# Patient Record
Sex: Male | Born: 2000 | Race: Black or African American | Hispanic: No | Marital: Single | Smoking: Never smoker
Health system: Southern US, Community
[De-identification: ages and names within clinical notes are randomized; demographics above are authoritative.]

## PROBLEM LIST (undated history)

## (undated) DIAGNOSIS — J45909 Unspecified asthma, uncomplicated: Secondary | ICD-10-CM

## (undated) HISTORY — PX: OTHER SURGICAL HISTORY: SHX169

---
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
## 2018-06-23 ENCOUNTER — Emergency Department
Admission: EM | Admit: 2018-06-23 | Discharge: 2018-06-23 | Disposition: A | Discharge status: Home / Self Care | Payer: Medicaid Other | Attending: Student in an Organized Health Care Education/Training Program | Admitting: Student in an Organized Health Care Education/Training Program

## 2018-06-23 ENCOUNTER — Encounter: Payer: Self-pay | Admitting: Emergency Medicine

## 2018-06-23 ENCOUNTER — Emergency Department: Payer: Medicaid Other

## 2018-06-23 ENCOUNTER — Other Ambulatory Visit: Payer: Self-pay

## 2018-06-23 DIAGNOSIS — Y92310 Basketball court as the place of occurrence of the external cause: Secondary | ICD-10-CM | POA: Insufficient documentation

## 2018-06-23 DIAGNOSIS — X509XXA Other and unspecified overexertion or strenuous movements or postures, initial encounter: Secondary | ICD-10-CM | POA: Insufficient documentation

## 2018-06-23 DIAGNOSIS — Y999 Unspecified external cause status: Secondary | ICD-10-CM | POA: Insufficient documentation

## 2018-06-23 DIAGNOSIS — S63502A Unspecified sprain of left wrist, initial encounter: Secondary | ICD-10-CM | POA: Insufficient documentation

## 2018-06-23 DIAGNOSIS — Y9367 Activity, basketball: Secondary | ICD-10-CM | POA: Insufficient documentation

## 2018-06-23 NOTE — ED Notes (Addendum)
See triage note  Developed pain to left wrist for about 3 days  States pain started while playing b/b  Good pulses   No deformity

## 2018-06-23 NOTE — ED Triage Notes (Signed)
C/O right wrist pain x 1 day.  Unsure if injured wrist.  Pain with movement.  Full ROM demonstrated.  No swelling seen.

## 2018-06-23 NOTE — Discharge Instructions (Addendum)
Follow-up with your regular doctor or Dr. Allena KatzPatel if not better in 5 7 days.  Wear the wrist splint during the day for the next 3 to 4 days.  Wear it at night for 1 week.  If it is not improved at that time please call Dr. Eliane DecreePatel's office.  Take Tylenol or ibuprofen.  Apply ice to the left wrist.  Return as needed.

## 2018-06-23 NOTE — ED Provider Notes (Signed)
[facility] Emergency Department Provider Note  ____________________________________________   First MD Initiated Contact with Patient 06/23/18 1154     (approximate)  I have reviewed the triage vital signs and the nursing notes.   HISTORY  Chief Complaint Wrist Pain    HPI Coralee NorthGregg Coad is a 17 y.o. male presents emergency department complaining of left wrist pain for about 3 days.  He states he played basketball last week and thinks he injured it while playing.  He denies any numbness or tingling.  Denies any other injuries.    History reviewed. No pertinent past medical history.  There are no active problems to display for this patient.   Past Surgical History:  Procedure Laterality Date  . thumb surgery Right     Prior to Admission medications   Not on File    Allergies Patient has no known allergies.  No family history on file.  Social History Social History   Tobacco Use  . Smoking status: Never Smoker  . Smokeless tobacco: Never Used  Substance Use Topics  . Alcohol use: No  . Drug use: No    Review of Systems  Constitutional: No fever/chills Eyes: No visual changes. ENT: No sore throat. Respiratory: Denies cough Genitourinary: Negative for dysuria. Musculoskeletal: Negative for back pain.  Positive left wrist pain Skin: Negative for rash.    ____________________________________________   PHYSICAL EXAM:  VITAL SIGNS: ED Triage Vitals  Enc Vitals Group     BP 06/23/18 1127 132/66     Pulse Rate 06/23/18 1127 64     Resp --      Temp 06/23/18 1127 98 F (36.7 C)     Temp Source 06/23/18 1127 Oral     SpO2 06/23/18 1127 99 %     Weight 06/23/18 1127 143 lb (64.9 kg)     Height 06/23/18 1127 5\' 8"  (1.727 m)     Head Circumference --      Peak Flow --      Pain Score 06/23/18 1129 7     Pain Loc --      Pain Edu? --      Excl. in GC? --     Constitutional: Alert and oriented. Well appearing and in no  acute distress. Eyes: Conjunctivae are normal.  Head: Atraumatic. Nose: No congestion/rhinnorhea. Mouth/Throat: Mucous membranes are moist.   Neck:  supple no lymphadenopathy noted Cardiovascular: Normal rate, regular rhythm.  Respiratory: Normal respiratory effort.  GU: deferred Musculoskeletal: FROM all extremities, warm and well perfused.  Left wrist is minimally tender along the carpal bones.  Neurovascular is intact.  He has full range of motion without any difficulty. Neurologic:  Normal speech and language.  Skin:  Skin is warm, dry and intact. No rash noted. Psychiatric: Mood and affect are normal. Speech and behavior are normal.  ____________________________________________   LABS (all labs ordered are listed, but only abnormal results are displayed)  Labs Reviewed - No data to display ____________________________________________   ____________________________________________  RADIOLOGY  X-ray left wrist is negative  ____________________________________________   PROCEDURES  Procedure(s) performed: Velcro cock-up splint was applied by nursing staff  Procedures    ____________________________________________   INITIAL IMPRESSION / ASSESSMENT AND PLAN / ED COURSE  Pertinent labs & imaging results that were available during my care of the patient were reviewed by me and considered in my medical decision making (see chart for details).   Patient is a 17 year old male presents emergency department complaining of left  wrist pain for about 3 days.  Thinks he injured it while playing basketball.  Physical exam patient appears well.  The left wrist is minimally tender along the carpal bones.  Neurovascular is intact.  And he has full range of motion.  X-ray of the left wrist is negative for any acute abnormalities  Explained the x-ray results to the patient.  A cock-up splint was applied by the nursing staff.  He is to take over-the-counter Tylenol or ibuprofen  for pain as needed.  Apply ice.  Follow-up with orthopedics if not better in 7 to 10 days.  He states he understands will comply with instructions.  He was discharged in stable condition     As part of my medical decision making, I reviewed the following data within the electronic MEDICAL RECORD NUMBER Nursing notes reviewed and incorporated, Radiograph reviewed x-ray left wrist is negative, Notes from prior ED visits and [place] Controlled Substance Database  ____________________________________________   FINAL CLINICAL IMPRESSION(S) / ED DIAGNOSES  Final diagnoses:  Sprain of left wrist, initial encounter      NEW MEDICATIONS STARTED DURING THIS VISIT:  Current Discharge Medication List       Note:  This document was prepared using Dragon voice recognition software and may include unintentional dictation errors.    Faythe Ghee, PA-C 06/23/18 1254    Willy Eddy, MD 06/23/18 1329
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
## 2019-10-07 ENCOUNTER — Ambulatory Visit: Payer: Medicaid Other | Attending: Internal Medicine

## 2019-10-08 ENCOUNTER — Ambulatory Visit: Payer: Medicaid Other | Attending: Internal Medicine

## 2019-10-08 DIAGNOSIS — Z20822 Contact with and (suspected) exposure to covid-19: Secondary | ICD-10-CM

## 2019-10-10 LAB — NOVEL CORONAVIRUS, NAA: SARS-CoV-2, NAA: NOT DETECTED

## 2019-10-24 IMAGING — DX DG WRIST COMPLETE 3+V*L*
4 series · 4 of 4 positions shown · non-contrast
Comparison: None.

CLINICAL DATA: Left wrist pain x 2 days. Pt states pain is "inside
the joint not on the outside." Pt also pointed to posterior aspect
of wrist w/ flexion small marble sized lump noted mid posterior
wrist. Pt unsure of injury states he plays basketball so it could've
happened then but doesn't recall anything. No prior injury or
surgery.

EXAM:
LEFT WRIST - COMPLETE 3+ VIEW

[wrist ap (1 of 2)]
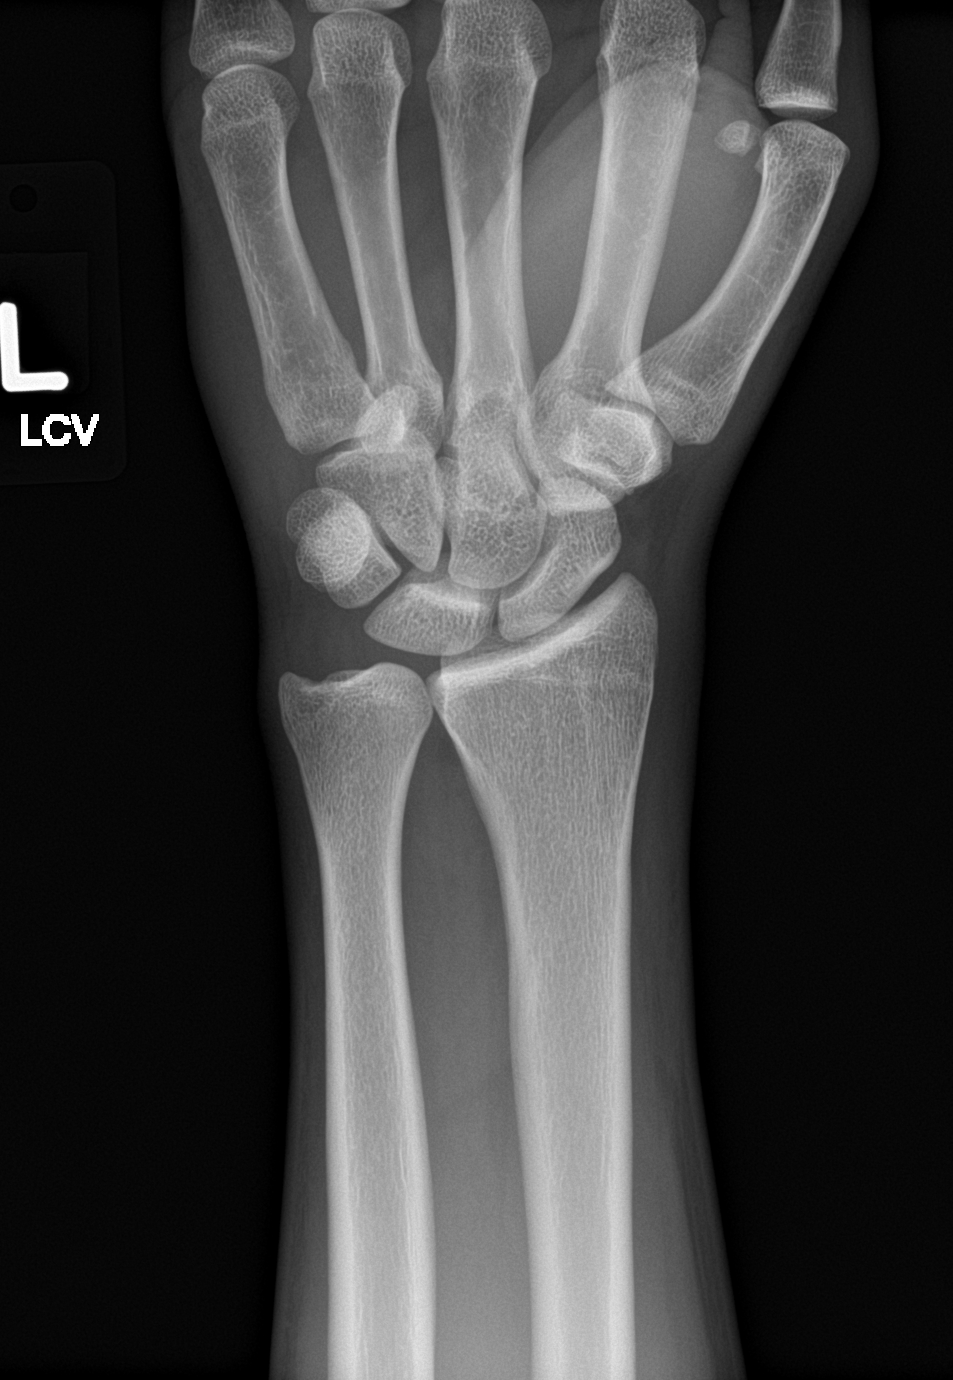

[wrist obl]
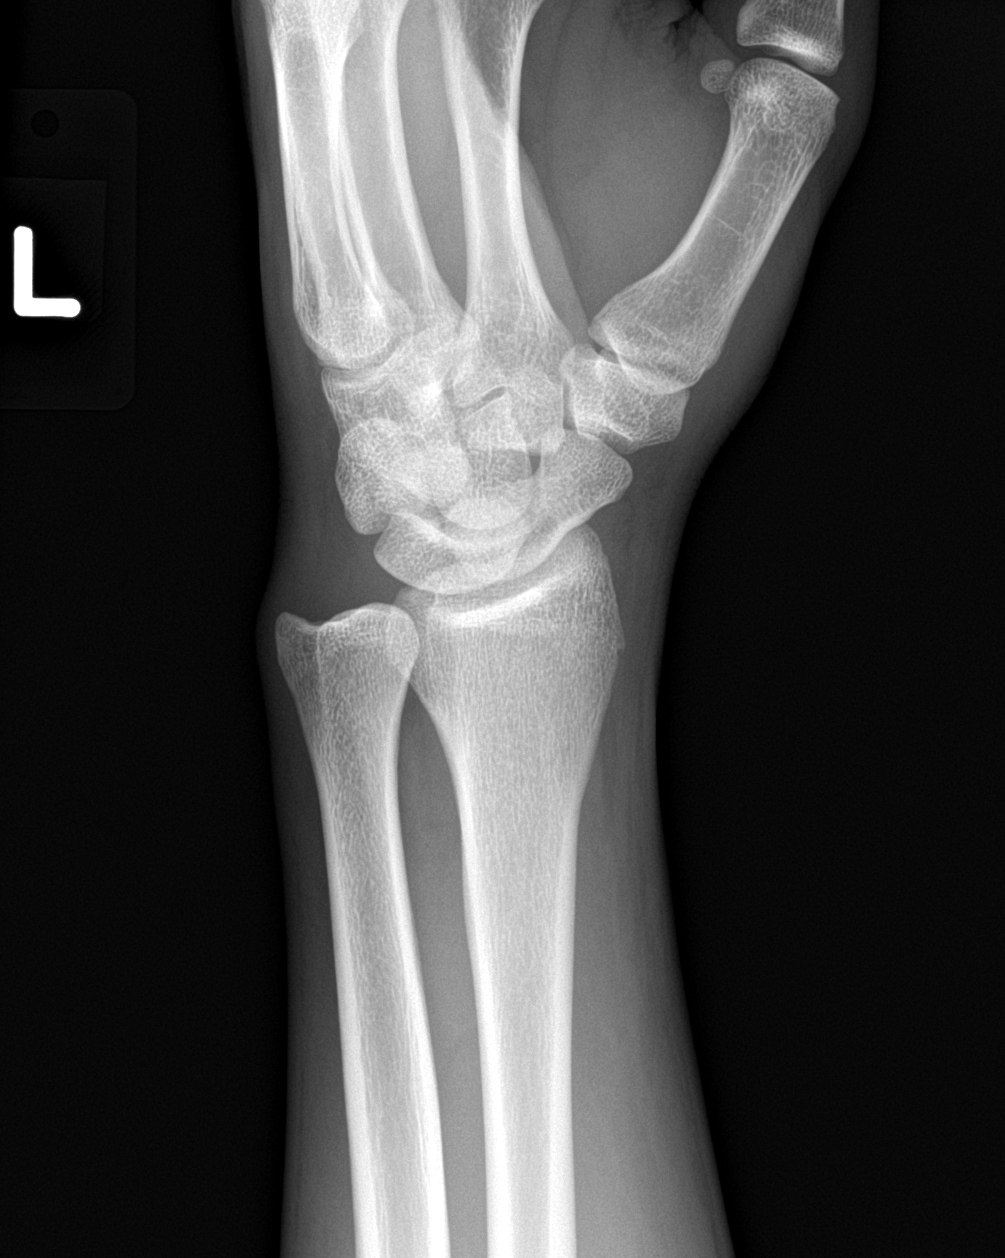

[wrist lat]
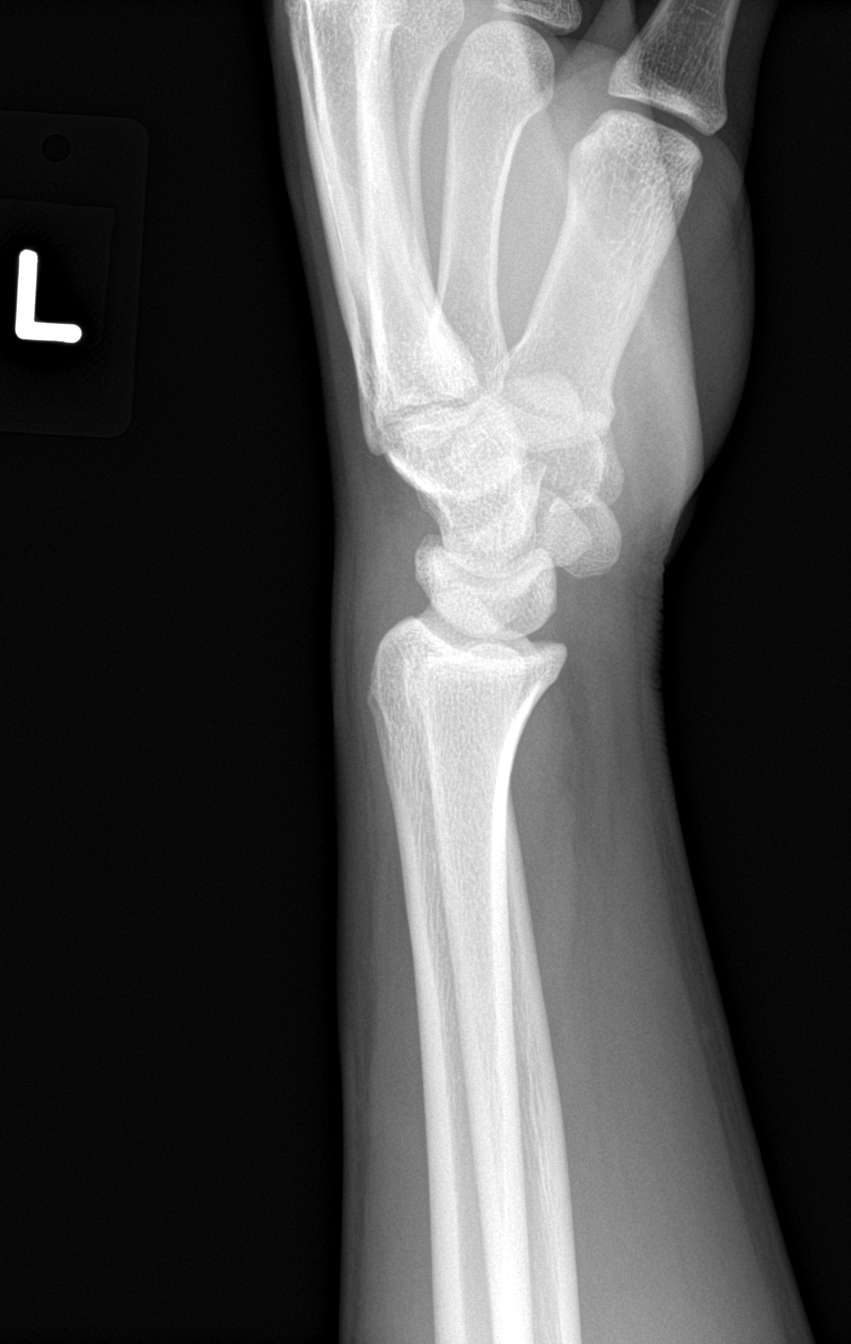

[wrist ap (2 of 2)]
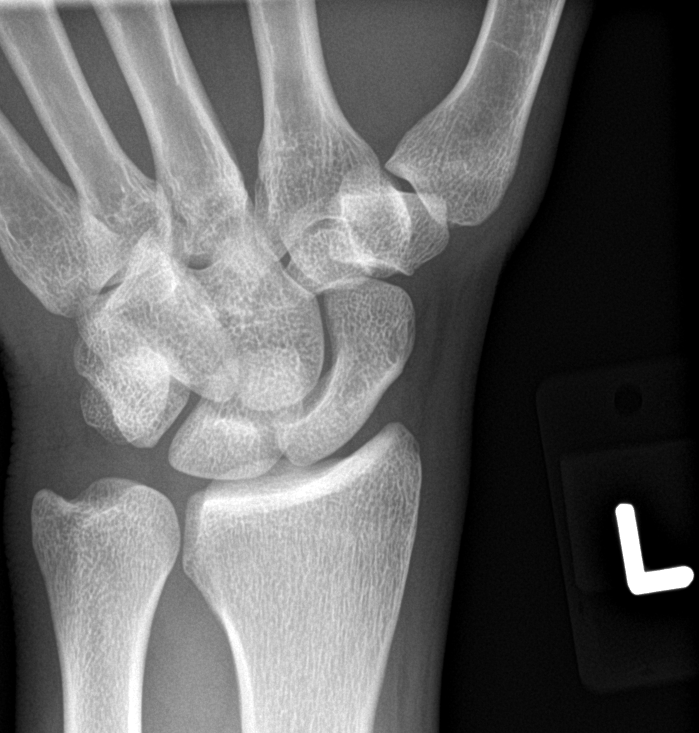

[4 of 4 positions shown; findings below may reference images not displayed]

FINDINGS: There is no evidence of fracture or dislocation. There is no
evidence of arthropathy or other focal bone abnormality. Soft
tissues are unremarkable.
IMPRESSION: Negative.

## 2019-12-02 ENCOUNTER — Other Ambulatory Visit: Payer: Self-pay

## 2019-12-02 ENCOUNTER — Emergency Department: Payer: Medicaid Other

## 2019-12-02 ENCOUNTER — Emergency Department
Admission: EM | Admit: 2019-12-02 | Discharge: 2019-12-02 | Disposition: A | Discharge status: Home / Self Care | Payer: Medicaid Other | Attending: Emergency Medicine | Admitting: Emergency Medicine

## 2019-12-02 ENCOUNTER — Encounter: Payer: Self-pay | Admitting: Emergency Medicine

## 2019-12-02 DIAGNOSIS — Y929 Unspecified place or not applicable: Secondary | ICD-10-CM | POA: Insufficient documentation

## 2019-12-02 DIAGNOSIS — Y999 Unspecified external cause status: Secondary | ICD-10-CM | POA: Insufficient documentation

## 2019-12-02 DIAGNOSIS — S61011A Laceration without foreign body of right thumb without damage to nail, initial encounter: Secondary | ICD-10-CM | POA: Insufficient documentation

## 2019-12-02 DIAGNOSIS — F17200 Nicotine dependence, unspecified, uncomplicated: Secondary | ICD-10-CM | POA: Insufficient documentation

## 2019-12-02 DIAGNOSIS — S61411A Laceration without foreign body of right hand, initial encounter: Secondary | ICD-10-CM | POA: Insufficient documentation

## 2019-12-02 DIAGNOSIS — Y939 Activity, unspecified: Secondary | ICD-10-CM | POA: Insufficient documentation

## 2019-12-02 DIAGNOSIS — J45909 Unspecified asthma, uncomplicated: Secondary | ICD-10-CM | POA: Insufficient documentation

## 2019-12-02 DIAGNOSIS — W25XXXA Contact with sharp glass, initial encounter: Secondary | ICD-10-CM | POA: Insufficient documentation

## 2019-12-02 DIAGNOSIS — Z23 Encounter for immunization: Secondary | ICD-10-CM | POA: Insufficient documentation

## 2019-12-02 DIAGNOSIS — S61216A Laceration without foreign body of right little finger without damage to nail, initial encounter: Secondary | ICD-10-CM | POA: Insufficient documentation

## 2019-12-02 HISTORY — DX: Unspecified asthma, uncomplicated: J45.909

## 2019-12-02 MED ORDER — LIDOCAINE HCL (PF) 1 % IJ SOLN
5.0000 mL | Freq: Once | INTRAMUSCULAR | Status: AC
  Administered 2019-12-02: 22:00:00 5 mL via INTRADERMAL
  Filled 2019-12-02: qty 5

## 2019-12-02 MED ORDER — CEPHALEXIN 500 MG PO CAPS: 500.0000 mg | ORAL_CAPSULE | Freq: Four times a day (QID) | ORAL | 0 refills | Status: AC

## 2019-12-02 MED ORDER — CEFTRIAXONE SODIUM 1 G IJ SOLR
1.0000 g | Freq: Once | INTRAMUSCULAR | Status: AC
  Administered 2019-12-02: 22:00:00 1 g via INTRAMUSCULAR
  Filled 2019-12-02: qty 10

## 2019-12-02 MED ORDER — BACITRACIN ZINC 500 UNIT/GM EX OINT: TOPICAL_OINTMENT | Freq: Once | CUTANEOUS | Status: AC

## 2019-12-02 MED ORDER — LIDOCAINE-EPINEPHRINE (PF) 1 %-1:200000 IJ SOLN
10.0000 mL | Freq: Once | INTRAMUSCULAR | Status: DC
  Filled 2019-12-02: qty 10

## 2019-12-02 MED ORDER — TETANUS-DIPHTH-ACELL PERTUSSIS 5-2.5-18.5 LF-MCG/0.5 IM SUSP
0.5000 mL | Freq: Once | INTRAMUSCULAR | Status: AC
  Administered 2019-12-02: 22:00:00 0.5 mL via INTRAMUSCULAR
  Filled 2019-12-02: qty 0.5

## 2019-12-02 MED ORDER — TRAMADOL HCL 50 MG PO TABS: 50.0000 mg | ORAL_TABLET | Freq: Four times a day (QID) | ORAL | 0 refills | Status: AC | PRN
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
## 2019-12-02 NOTE — ED Notes (Signed)
Provider at bedside

## 2019-12-02 NOTE — ED Provider Notes (Signed)
[facility] Emergency Department Provider Note  ____________________________________________  Time seen: Approximately 8:38 PM  I have reviewed the triage vital signs and the nursing notes.   HISTORY  Chief Complaint Laceration    HPI Seth Harvey. is a 19 y.o. male that presents to the emergency department for evaluation of left hand laceration.  Patient states that he was leaning on a glass back door when the glass gave way and he went through the glass.  He has several lacerations to his hand.  He is having numbness to the side of his hand.  Past Medical History:  Diagnosis Date  . Asthma     There are no problems to display for this patient.   Past Surgical History:  Procedure Laterality Date  . thumb surgery Right     Prior to Admission medications   Medication Sig Start Date End Date Taking? Authorizing Provider  cephALEXin (KEFLEX) 500 MG capsule Take 1 capsule (500 mg total) by mouth 4 (four) times daily for 10 days. 12/02/19 12/12/19  Laban Emperor, PA-C  traMADol (ULTRAM) 50 MG tablet Take 1 tablet (50 mg total) by mouth every 6 (six) hours as needed. 12/02/19 12/01/20  Laban Emperor, PA-C    Allergies Patient has no known allergies.  No family history on file.  Social History Social History   Tobacco Use  . Smoking status: Current Every Day Smoker  . Smokeless tobacco: Never Used  Substance Use Topics  . Alcohol use: No  . Drug use: No     Review of Systems  Respiratory: No SOB. Gastrointestinal: No abdominal pain.  No nausea, no vomiting.  Musculoskeletal: Positive for hand pain. Skin: Negative for rash, abrasions, ecchymosis.  Positive for lacerations. Neurological: Negative for headache.  Positive for numbness and tingling.   ____________________________________________   PHYSICAL EXAM:  VITAL SIGNS: ED Triage Vitals  Enc Vitals Group     BP 12/02/19 1943 118/83     Pulse Rate 12/02/19 1943 65     Resp --       Temp 12/02/19 1943 98 F (36.7 C)     Temp Source 12/02/19 1943 Oral     SpO2 12/02/19 1943 98 %     Weight 12/02/19 1939 160 lb (72.6 kg)     Height 12/02/19 1939 5\' 8"  (1.727 m)     Head Circumference --      Peak Flow --      Pain Score 12/02/19 1941 10     Pain Loc --      Pain Edu? --      Excl. in [place]? --      Constitutional: Alert and oriented. Well appearing and in no acute distress. Eyes: Conjunctivae are normal. PERRL. EOMI. Head: Atraumatic. ENT:      Ears:      Nose: No congestion/rhinnorhea.      Mouth/Throat: Mucous membranes are moist.  Neck: No stridor.  Cardiovascular: Normal rate, regular rhythm.  Good peripheral circulation. Respiratory: Normal respiratory effort without tachypnea or retractions. Lungs CTAB. Good air entry to the bases with no decreased or absent breath sounds. Musculoskeletal: Full range of motion to all extremities. No gross deformities appreciated.  Full range of motion of all 5 digits.  Able to perform resisted flexion of each digit.  2 point sensation intact.  Neurologic:  Normal speech and language. No gross focal neurologic deficits are appreciated.  Skin:  Skin is warm, dry. Multiple lacerations to right hand.  No  visible retained foreign bodies. Psychiatric: Mood and affect are normal. Speech and behavior are normal. Patient exhibits appropriate insight and judgement.   ____________________________________________   LABS (all labs ordered are listed, but only abnormal results are displayed)  Labs Reviewed - No data to display ____________________________________________  EKG   ____________________________________________  RADIOLOGY Lexine Baton, personally viewed and evaluated these images (plain radiographs) as part of my medical decision making, as well as reviewing the written report by the radiologist.  DG Hand Complete Right  Result Date: 12/02/2019 CLINICAL DATA:  Fall with laceration EXAM: RIGHT HAND -  COMPLETE 3+ VIEW COMPARISON:  None. FINDINGS: Limited by positioning. No displaced fracture or malalignment. No radiopaque foreign body in the soft tissues. IMPRESSION: No acute osseous abnormality. No radiopaque foreign body is identified Electronically Signed   By: Jasmine Pang M.D.   On: 12/02/2019 19:55    ____________________________________________    PROCEDURES  Procedure(s) performed:    Procedures  LACERATION REPAIR Performed by: Enid Derry  Consent: Verbal consent obtained.  Consent given by: patient  Prepped and Draped in normal sterile fashion  Wound explored: No foreign bodies   Laceration Location: palm  Laceration Length: 5 cm  Anesthesia: None  Local anesthetic: lidocaine 1% with epinephrine  Anesthetic total: 4 ml  Irrigation method: syringe  Amount of cleaning: normal saline  Skin closure: 4-0 nylon and 6-0 rapid vicryl  Number of sutures: 14 nylon and 4 vicryl  Technique: Simple interrupted  Patient tolerance: Patient tolerated the procedure well with no immediate complications.  LACERATION REPAIR Performed by: Enid Derry  Consent: Verbal consent obtained.  Consent given by: patient  Prepped and Draped in normal sterile fashion  Wound explored: No foreign bodies   Laceration Location: little finger  Laceration Length: 2 cm  Anesthesia: None  Local anesthetic: lidocaine 1% without epinephrine  Anesthetic total: 3  ml  Irrigation method: syringe  Amount of cleaning: normal saline  Skin closure: 4-0 nylon  Number of sutures: 4-5  Technique: Simple interrupted  Patient tolerance: Patient tolerated the procedure well with no immediate complications.  LACERATION REPAIR Performed by: Enid Derry  Consent: Verbal consent obtained.  Consent given by: patient  Prepped and Draped in normal sterile fashion  Wound explored: No foreign bodies   Laceration Location: thumb  Laceration Length: 2  cm  Anesthesia: None  Local anesthetic: lidocaine 1% without epinephrine  Anesthetic total: 4 ml  Irrigation method: syringe  Amount of cleaning: normal saline  Skin closure: 4-0 nylon  Number of sutures: 4-5  Technique: Simple interrupted  Patient tolerance: Patient tolerated the procedure well with no immediate complications.  LACERATION REPAIR Performed by: Enid Derry  Consent: Verbal consent obtained.  Consent given by: patient  Prepped and Draped in normal sterile fashion  Wound explored: No foreign bodies   Laceration Location: palm  Laceration Length: 1/2 cm  Anesthesia: None  Local anesthetic: lidocaine 1% without epinephrine  Anesthetic total: 1 ml  Irrigation method: syringe  Amount of cleaning: normal saline  Skin closure: 4-0 nylon  Number of sutures: 1  Technique: Simple interrupted  Patient tolerance: Patient tolerated the procedure well with no immediate complications.  Medications  lidocaine-EPINEPHrine (XYLOCAINE-EPINEPHrine) 1 %-1:200000 (PF) injection 10 mL (has no administration in time range)  bacitracin ointment (has no administration in time range)  lidocaine (PF) (XYLOCAINE) 1 % injection 5 mL (5 mLs Intradermal Given 12/02/19 2156)  cefTRIAXone (ROCEPHIN) injection 1 g (1 g Intramuscular Given 12/02/19  2150)  Tdap (BOOSTRIX) injection 0.5 mL (0.5 mLs Intramuscular Given 12/02/19 2151)     ____________________________________________   INITIAL IMPRESSION / ASSESSMENT AND PLAN / ED COURSE  Pertinent labs & imaging results that were available during my care of the patient were reviewed by me and considered in my medical decision making (see chart for details).  Review of the [place] CSRS was performed in accordance of the [facility] prior to dispensing any controlled drugs.   Patient presented the emergency department for evaluation of multiple right hand lacerations.  X-ray negative for acute bony abnormality or radiopaque  foreign body.  No visible foreign bodies on exam.  Lacerations were repaired with both absorbable and nonabsorbable stitches.  Patient also has shave skin avulsions to lateral hand that were bandaged.  Bleeding was controlled with Surgicel.  Patient is able to move all 5 digits.  Tetanus shot was updated.  IM Rocephin was given to prevent infection.  Volar hand splint was placed.  Ptient will be discharged home with prescriptions for Keflex and a short course of tramadol. Patient is to follow up with hand orthopedics as directed.  Patient is agreeable to call tomorrow morning for a follow-up appointment within 48 hours.  Patient is given ED precautions to return to the ED for any worsening or new symptoms.  [name] Brittan Mapel. was evaluated in Emergency Department on 12/02/2019 for the symptoms described in the history of present illness. He was evaluated in the context of the global COVID-19 pandemic, which necessitated consideration that the patient might be at risk for infection with the SARS-CoV-2 virus that causes COVID-19. Institutional protocols and algorithms that pertain to the evaluation of patients at risk for COVID-19 are in a state of rapid change based on information released by regulatory bodies including the CDC and federal and state organizations. These policies and algorithms were followed during the patient's care in the ED.   ____________________________________________  FINAL CLINICAL IMPRESSION(S) / ED DIAGNOSES  Final diagnoses:  Laceration of right hand without foreign body, initial encounter      NEW MEDICATIONS STARTED DURING THIS VISIT:  ED Discharge Orders         Ordered    cephALEXin (KEFLEX) 500 MG capsule  4 times daily     12/02/19 2254    traMADol (ULTRAM) 50 MG tablet  Every 6 hours PRN     12/02/19 2254              This chart was dictated using voice recognition software/Dragon. Despite best efforts to proofread, errors can occur which can change  the meaning. Any change was purely unintentional.    Enid Derry, PA-C 12/02/19 2339    Dionne Bucy, MD 12/03/19 [phone]

## 2020-02-24 ENCOUNTER — Ambulatory Visit: Payer: Medicaid Other

## 2020-03-03 ENCOUNTER — Ambulatory Visit: Payer: Medicaid Other

## 2020-03-15 ENCOUNTER — Encounter: Payer: Self-pay | Admitting: Physician Assistant

## 2020-03-15 ENCOUNTER — Other Ambulatory Visit: Payer: Self-pay

## 2020-03-15 ENCOUNTER — Ambulatory Visit: Payer: Self-pay | Admitting: Physician Assistant

## 2020-03-15 DIAGNOSIS — Z202 Contact with and (suspected) exposure to infections with a predominantly sexual mode of transmission: Secondary | ICD-10-CM

## 2020-03-15 DIAGNOSIS — Z113 Encounter for screening for infections with a predominantly sexual mode of transmission: Secondary | ICD-10-CM

## 2020-03-15 LAB — GRAM STAIN

## 2020-03-15 MED ORDER — DOXYCYCLINE HYCLATE 100 MG PO TABS
100.0000 mg | ORAL_TABLET | Freq: Two times a day (BID) | ORAL | 0 refills | Status: AC
Start: 2020-03-15 — End: 2020-03-22

## 2020-03-15 MED ORDER — CEFTRIAXONE SODIUM 500 MG IJ SOLR
500.0000 mg | Freq: Once | INTRAMUSCULAR | Status: AC
  Administered 2020-03-15: 500 mg via INTRAMUSCULAR

## 2020-03-15 NOTE — Progress Notes (Signed)
[facility] Department STI clinic/screening visit  Subjective:  Seth Harvey. is a 19 y.o. male being seen today for an STI screening visit. The patient reports they do have symptoms.    Patient has the following medical conditions:  There are no problems to display for this patient.    Chief Complaint  Patient presents with  . SEXUALLY TRANSMITTED DISEASE    screening    HPI  Patient reports that he has had an "irritating" feeling inside his penis for 1-2 weeks and has had a clear discharge for about 1 week.  Reports asthma that he does not use any medication for regularly.  States that he has not had a HIV test in the past.   See flowsheet for further details and programmatic requirements.    The following portions of the patient's history were reviewed and updated as appropriate: allergies, current medications, past medical history, past social history, past surgical history and problem list.  Objective:  There were no vitals filed for this visit.  Physical Exam Constitutional:      General: He is not in acute distress.    Appearance: Normal appearance.  HENT:     Head: Normocephalic and atraumatic.     Comments: No nits, lice, or hair loss. No cervical, supraclavicular or axillary adenopathy.    Mouth/Throat:     Mouth: Mucous membranes are moist.     Pharynx: Oropharynx is clear. No oropharyngeal exudate or posterior oropharyngeal erythema.  Eyes:     Conjunctiva/sclera: Conjunctivae normal.  Pulmonary:     Effort: Pulmonary effort is normal.  Abdominal:     Palpations: Abdomen is soft. There is no mass.     Tenderness: There is no abdominal tenderness. There is no guarding or rebound.  Genitourinary:    Penis: Normal.      Testes: Normal.     Comments: Pubic area without nits, lice, edema, erythema, lesions and inguinal adenopathy. Penis circumcised, without rash, lesions and discharge at meatus. Musculoskeletal:     Cervical back: Neck  supple. No tenderness.  Skin:    General: Skin is warm and dry.     Findings: No bruising, erythema, lesion or rash.  Neurological:     Mental Status: He is alert and oriented to person, place, and time.  Psychiatric:        Mood and Affect: Mood normal.        Behavior: Behavior normal.        Thought Content: Thought content normal.        Judgment: Judgment normal.       Assessment and Plan:  Shawna Kiener. is a 19 y.o. male presenting to the [facility] Department for STI screening  1. Screening for STD (sexually transmitted disease) Patient into clinic with symptoms. Rec condoms with all sex. Await test results.  Counseled that RN will call if needs to RTC for further treatment once results are back. - Gram stain - Gonococcus culture - HIV [place] [facility] - Syphilis Serology, [place] [facility]  2. Gonorrhea contact Treat as a contact to [facility] and to cover for Chlamydia with Ceftriaxone 500mg  IM and Doxycycline 100mg   #14 1 po BID for 7 days. No sex until 7 days after completing treatment and until after partner completes treatment. RTC or call with questions or concerns. - cefTRIAXone (ROCEPHIN) injection 500 mg - doxycycline (VIBRA-TABS) 100 MG tablet; Take 1 tablet (100 mg total) by mouth 2 (two)  times daily for 7 days.  Dispense: 14 tablet; Refill: 0     Return for 2-3 weeks for TR's, and PRN.  No future appointments.  Jerene Dilling, PA

## 2020-03-15 NOTE — Progress Notes (Signed)
Gram stain reviewed and is negative today. Pt treated as a contact to gonorrhea per Sadie Haber, PA order. Pt tolerated well. Provider orders completed.

## 2020-03-20 LAB — GONOCOCCUS CULTURE

## 2020-07-22 ENCOUNTER — Other Ambulatory Visit: Payer: Self-pay

## 2020-07-22 ENCOUNTER — Encounter: Payer: Self-pay | Admitting: Physician Assistant

## 2020-07-22 ENCOUNTER — Ambulatory Visit: Payer: Self-pay | Admitting: Physician Assistant

## 2020-07-22 DIAGNOSIS — Z113 Encounter for screening for infections with a predominantly sexual mode of transmission: Secondary | ICD-10-CM

## 2020-07-22 LAB — GRAM STAIN

## 2020-07-22 LAB — HM HIV SCREENING LAB: HM HIV Screening: NEGATIVE

## 2020-07-22 NOTE — Progress Notes (Signed)
Wet mount reviewed and no treatment indicated per standing order. Philicia Heyne, RN  

## 2020-07-22 NOTE — Progress Notes (Signed)
   [facility] Department STI clinic/screening visit  Subjective:  Seth Harvey. is a 19 y.o. male being seen today for an STI screening visit. The patient reports they do have symptoms.    Patient has the following medical conditions:  There are no problems to display for this patient.    Chief Complaint  Patient presents with  . SEXUALLY TRANSMITTED DISEASE    19 yo requesting STI screen and eval of dry area on penis for 1 week. 1 recent partner has herpes.   Patient reports dry skin on penis for 1 week, partner has herpes.   See flowsheet for further details and programmatic requirements.    The following portions of the patient's history were reviewed and updated as appropriate: allergies, current medications, past medical history, past social history, past surgical history and problem list.  Objective:  There were no vitals filed for this visit.  Physical Exam Constitutional:      Appearance: Normal appearance.  HENT:     Head: Normocephalic and atraumatic.     Comments: No nits or hair loss    Mouth/Throat:     Mouth: Mucous membranes are moist.     Pharynx: Oropharynx is clear. No oropharyngeal exudate or posterior oropharyngeal erythema.  Pulmonary:     Effort: Pulmonary effort is normal.  Abdominal:     General: Abdomen is flat.     Palpations: Abdomen is soft. There is no hepatomegaly or mass.     Tenderness: There is no abdominal tenderness.  Genitourinary:    Pubic Area: No rash or pubic lice.      Testes: Normal.     Epididymis:     Right: Normal.     Left: Normal.     Rectum: Normal.     Comments: 67mm horiz fissure on anterior penis about 1 cm from corona. Nontender, no erythema. Skin is dry. Lymphadenopathy:     Head:     Right side of head: No preauricular or posterior auricular adenopathy.     Left side of head: No preauricular or posterior auricular adenopathy.     Cervical: No cervical adenopathy.     Upper Body:      Right upper body: No supraclavicular or axillary adenopathy.     Left upper body: No supraclavicular or axillary adenopathy.     Lower Body: No right inguinal adenopathy. No left inguinal adenopathy.  Skin:    General: Skin is warm and dry.     Findings: No rash.  Neurological:     Mental Status: He is alert and oriented to person, place, and time.       Assessment and Plan:  Draco Malczewski. is a 19 y.o. male presenting to the [facility] Department for STI screening  1. Routine screening for STI (sexually transmitted infection) Pt uses marijuana, declines hep B/C screening. Treat per S.O. if indicated per Gram stain result. - HIV [place] [facility] - Syphilis Serology, [place] [facility] - Gonococcus culture - Gram stain     Return if symptoms worsen or fail to improve.  No future appointments.  Landry Dyke, PA-C

## 2020-07-26 LAB — GONOCOCCUS CULTURE

## 2021-02-21 ENCOUNTER — Ambulatory Visit: Payer: Medicaid Other

## 2021-02-24 ENCOUNTER — Ambulatory Visit: Payer: Medicaid Other

## 2021-03-11 ENCOUNTER — Ambulatory Visit: Payer: Medicaid Other

## 2021-03-31 ENCOUNTER — Other Ambulatory Visit: Payer: Self-pay

## 2021-03-31 ENCOUNTER — Encounter: Payer: Self-pay | Admitting: Physician Assistant

## 2021-03-31 ENCOUNTER — Ambulatory Visit: Payer: Self-pay | Admitting: Physician Assistant

## 2021-03-31 DIAGNOSIS — Z113 Encounter for screening for infections with a predominantly sexual mode of transmission: Secondary | ICD-10-CM

## 2021-03-31 LAB — GRAM STAIN

## 2021-03-31 NOTE — Progress Notes (Signed)
[facility] Department STI clinic/screening visit  Subjective:  Seth Janik. is a 20 y.o. male being seen today for an STI screening visit. The patient reports they do have symptoms.    Patient has the following medical conditions:  There are no problems to display for this patient.    Chief Complaint  Patient presents with   SEXUALLY TRANSMITTED DISEASE    screening    HPI  Patient reports that he has had a "tingling" feeling inside his penis for 1-2 weeks.  Reports that he was also recently told by a partner that she has tested positive for HSV.  Patient denies any other symptoms.  Reports a history of asthma and has had surgery on his right thumb.  States last HIV test was in 2021 and last void prior to sample collection for Gram stain was less than 2 hr ago.   See flowsheet for further details and programmatic requirements.    The following portions of the patient's history were reviewed and updated as appropriate: allergies, current medications, past medical history, past social history, past surgical history and problem list.  Objective:  There were no vitals filed for this visit.  Physical Exam Constitutional:      General: He is not in acute distress.    Appearance: Normal appearance.  HENT:     Head: Normocephalic and atraumatic.     Comments: No nits,lice, or hair loss. No cervical, supraclavicular or axillary adenopathy.     Mouth/Throat:     Mouth: Mucous membranes are moist.     Pharynx: Oropharynx is clear. No oropharyngeal exudate or posterior oropharyngeal erythema.  Eyes:     Conjunctiva/sclera: Conjunctivae normal.  Pulmonary:     Effort: Pulmonary effort is normal.  Abdominal:     Palpations: Abdomen is soft. There is no mass.     Tenderness: There is no abdominal tenderness. There is no guarding or rebound.  Genitourinary:    Penis: Normal.      Testes: Normal.     Comments: Pubic area without nits, lice, hair loss, edema,  erythema, lesions and inguinal adenopathy. Penis circumcised without rash, lesions and discharge at meatus. Testicles descended bilaterally,nt, no masses or edema.  Musculoskeletal:     Cervical back: Neck supple. No tenderness.  Skin:    General: Skin is warm and dry.     Findings: No bruising, erythema, lesion or rash.  Neurological:     Mental Status: He is alert and oriented to person, place, and time.  Psychiatric:        Mood and Affect: Mood normal.        Behavior: Behavior normal.        Thought Content: Thought content normal.        Judgment: Judgment normal.      Assessment and Plan:  Seth Flagg. is a 20 y.o. male presenting to the [facility] Department for STI screening  1. Screening for STD (sexually transmitted disease) Patient into clinic with symptoms. Patient declines blood work today. Reviewed with patient that Gram stain is normal and no treatment is indicated today. Counseled patient re:  HSV and limitations on testing at [facility].  Enc patient to follow up for serology with PCP or urgent care or RTC ASAP if has lesions appear for culture at [facility]. Rec condoms with all sex. Await test results.  Counseled that RN will call if needs to RTC for treatment once results are back.  -  Gram stain - Gonococcus culture     No follow-ups on file.  No future appointments.  Matt Holmes, PA

## 2021-04-03 IMAGING — DX DG HAND COMPLETE 3+V*R*
3 series · 3 of 3 positions shown · non-contrast
Comparison: None.

CLINICAL DATA: Fall with laceration

EXAM:
RIGHT HAND - COMPLETE 3+ VIEW

[hand ap]
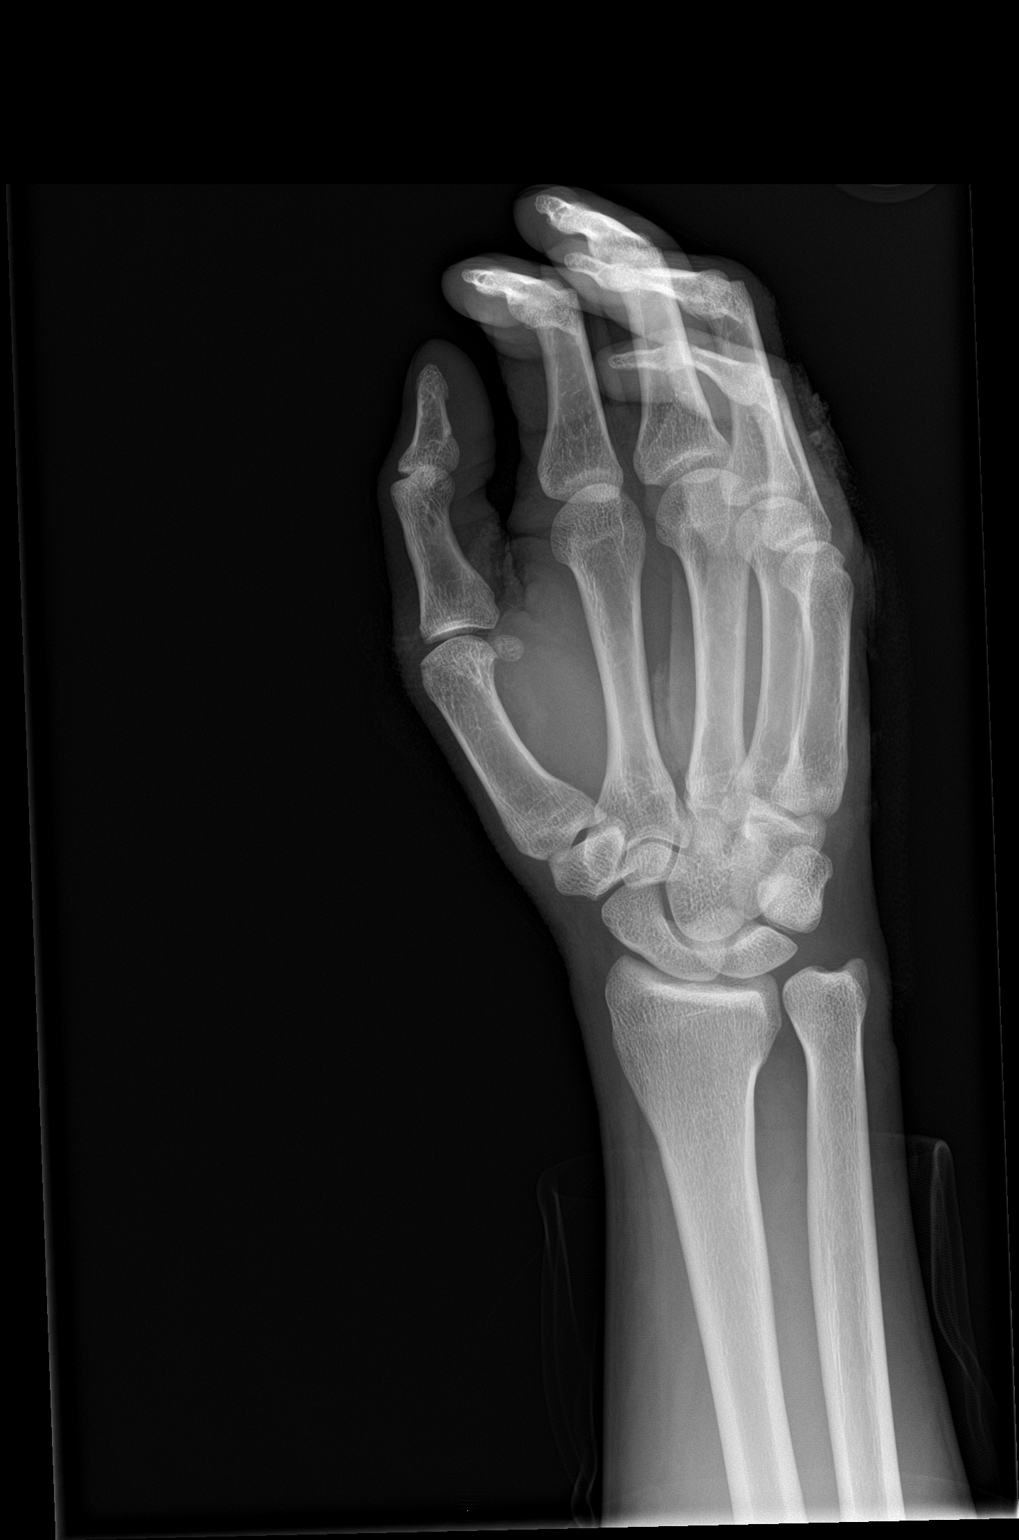

[hand obl]
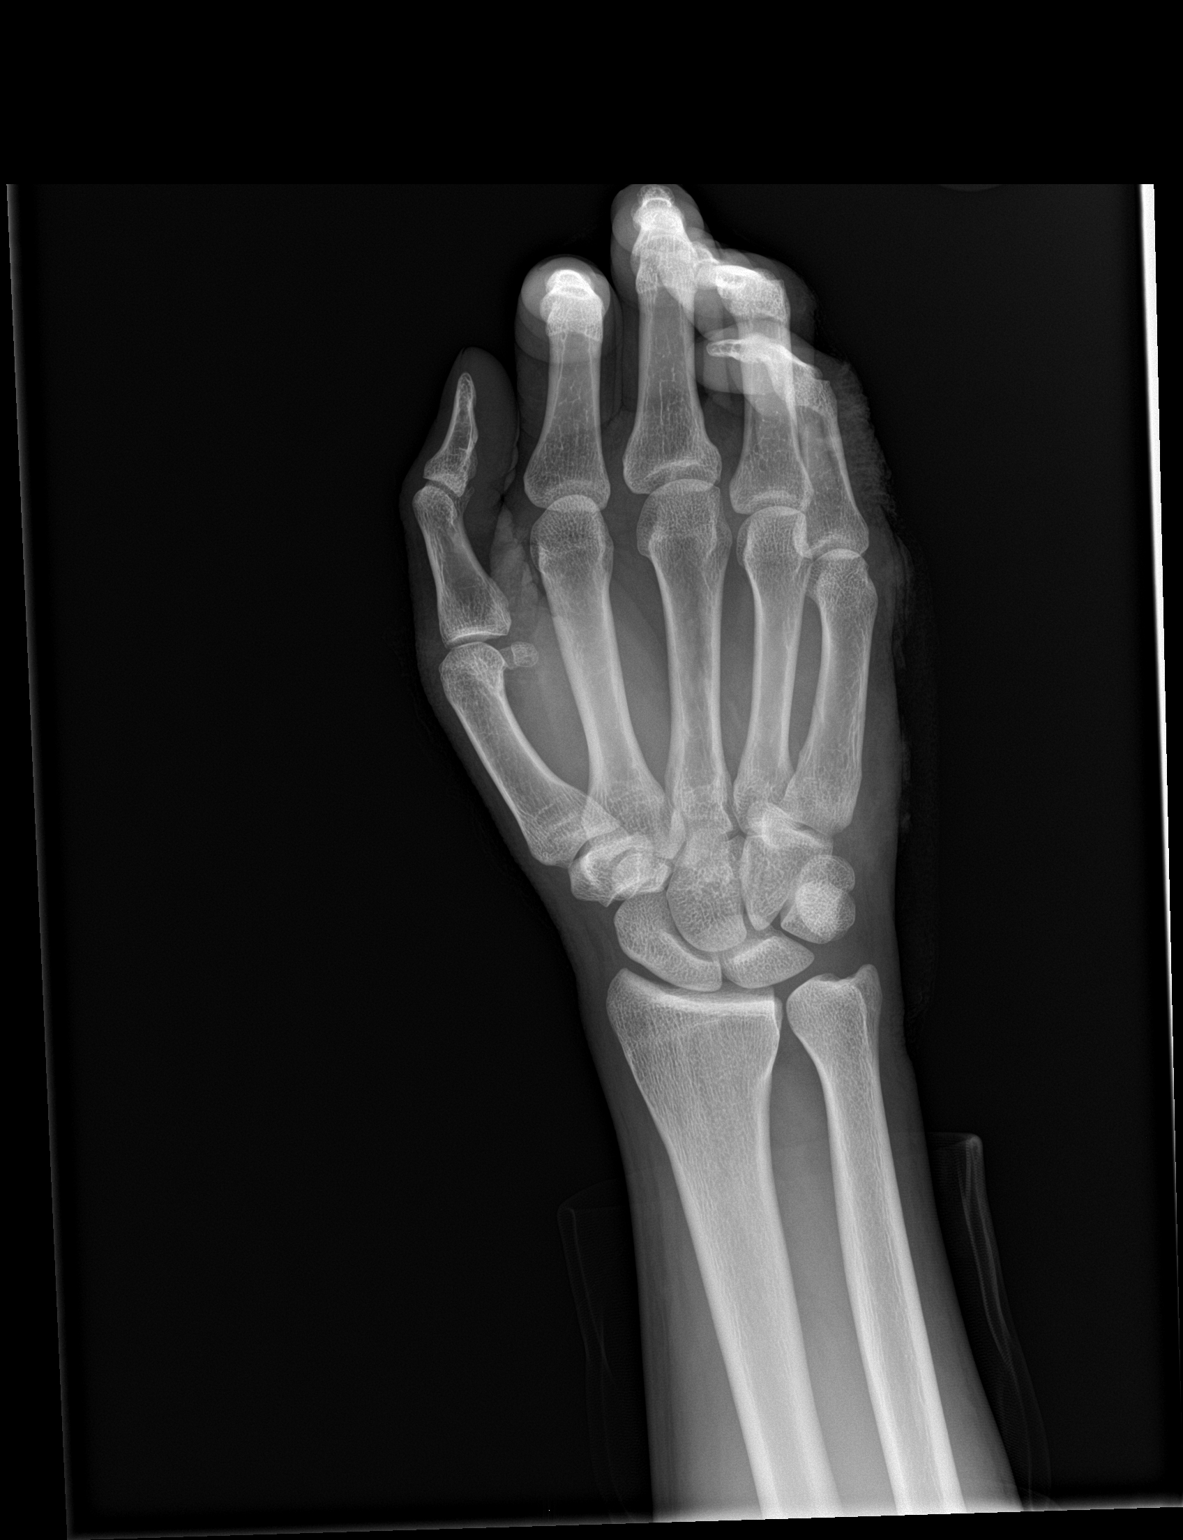

[hand lat]
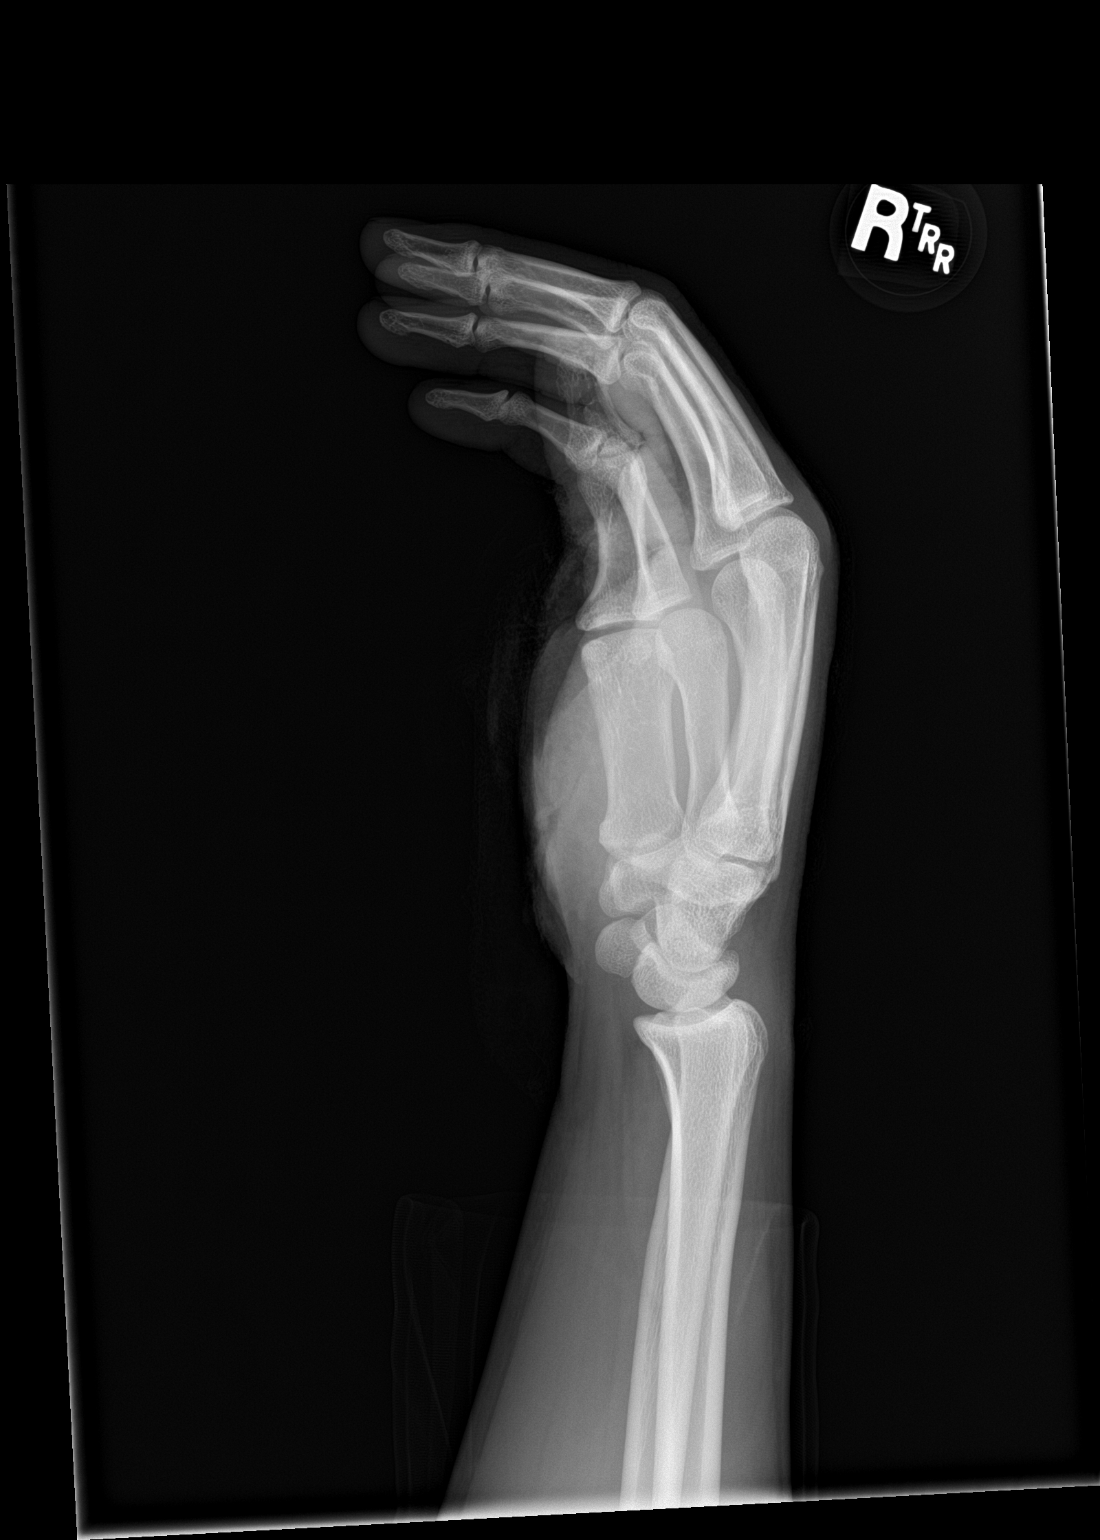

[3 of 3 positions shown; findings below may reference images not displayed]

FINDINGS: Limited by positioning. No displaced fracture or malalignment. No
radiopaque foreign body in the soft tissues.
IMPRESSION: No acute osseous abnormality. No radiopaque foreign body is
identified

## 2021-04-04 LAB — GONOCOCCUS CULTURE

## 2021-08-05 ENCOUNTER — Ambulatory Visit
Admission: EM | Admit: 2021-08-05 | Discharge: 2021-08-05 | Disposition: A | Discharge status: Home / Self Care | Payer: Self-pay | Attending: Physician Assistant | Admitting: Physician Assistant

## 2021-08-05 ENCOUNTER — Telehealth (HOSPITAL_COMMUNITY): Payer: Self-pay | Admitting: Emergency Medicine

## 2021-08-05 ENCOUNTER — Other Ambulatory Visit: Payer: Self-pay

## 2021-08-05 DIAGNOSIS — J101 Influenza due to other identified influenza virus with other respiratory manifestations: Secondary | ICD-10-CM

## 2021-08-05 LAB — POCT INFLUENZA A/B
Influenza A, POC: POSITIVE — AB
Influenza B, POC: NEGATIVE

## 2021-08-05 MED ORDER — OSELTAMIVIR PHOSPHATE 6 MG/ML PO SUSR: 75.0000 mg | Freq: Two times a day (BID) | ORAL | 0 refills | Status: AC

## 2021-08-05 MED ORDER — OSELTAMIVIR PHOSPHATE 75 MG PO CAPS: 75.0000 mg | ORAL_CAPSULE | Freq: Two times a day (BID) | ORAL | 0 refills | Status: DC

## 2021-08-05 NOTE — ED Provider Notes (Signed)
[facility]    CSN: [id number] Arrival date & time: 08/05/21  0818      History   Chief Complaint Chief Complaint  Patient presents with   Fever    HPI [facility] Naveed Humphres. is a 20 y.o. male.   Patient here today for evaluation of nasal congestion and drainage, cough, sore throat, nausea, vomiting that started about 5 days ago.  Vomiting did just started last night.  He has had some fever as well.  Has tried over-the-counter treatment without significant relief.  The history is provided by the patient.  Fever Associated symptoms: chills, congestion, cough, nausea, rhinorrhea, sore throat and vomiting   Associated symptoms: no diarrhea    Past Medical History:  Diagnosis Date   Asthma     There are no problems to display for this patient.   Past Surgical History:  Procedure Laterality Date   thumb surgery Right        Home Medications    Prior to Admission medications   Medication Sig Start Date End Date Taking? Authorizing Provider  oseltamivir (TAMIFLU) 75 MG capsule Take 1 capsule (75 mg total) by mouth every 12 (twelve) hours. 08/05/21  Yes Tomi Bamberger, PA-C    Family History History reviewed. No pertinent family history.  Social History Social History   Tobacco Use   Smoking status: Never   Smokeless tobacco: Never  Substance Use Topics   Alcohol use: No   Drug use: Yes    Types: Marijuana    Comment: Last marijuana use 07/22/2020 ~ 12 noon.     Allergies   Patient has no known allergies.   Review of Systems Review of Systems  Constitutional:  Positive for chills and fever.  HENT:  Positive for congestion, rhinorrhea and sore throat.   Eyes:  Negative for discharge and redness.  Respiratory:  Positive for cough. Negative for shortness of breath.   Gastrointestinal:  Positive for nausea and vomiting. Negative for diarrhea.    Physical Exam Triage Vital Signs ED Triage Vitals [08/05/21 0954]  Enc Vitals Group     BP  130/78     Pulse Rate 90     Resp 18     Temp 99.2 F (37.3 C)     Temp Source Oral     SpO2 97 %     Weight      Height      Head Circumference      Peak Flow      Pain Score 0     Pain Loc      Pain Edu?      Excl. in GC?    No data found.  Updated Vital Signs BP 130/78 (BP Location: Left Arm)   Pulse 90   Temp 99.2 F (37.3 C) (Oral)   Resp 18   SpO2 97%      Physical Exam Vitals and nursing note reviewed.  Constitutional:      General: He is not in acute distress.    Appearance: Normal appearance. He is not ill-appearing.  HENT:     Head: Normocephalic and atraumatic.     Nose: Congestion present.     Mouth/Throat:     Mouth: Mucous membranes are moist.     Pharynx: Oropharynx is clear. Posterior oropharyngeal erythema present. No oropharyngeal exudate.  Eyes:     Conjunctiva/sclera: Conjunctivae normal.  Cardiovascular:     Rate and Rhythm: Normal rate and regular rhythm.  Heart sounds: Normal heart sounds. No murmur heard. Pulmonary:     Effort: Pulmonary effort is normal. No respiratory distress.     Breath sounds: Normal breath sounds. No wheezing, rhonchi or rales.  Skin:    General: Skin is warm and dry.  Neurological:     Mental Status: He is alert.  Psychiatric:        Mood and Affect: Mood normal.        Thought Content: Thought content normal.     UC Treatments / Results  Labs (all labs ordered are listed, but only abnormal results are displayed) Labs Reviewed  POCT INFLUENZA A/B - Abnormal; Notable for the following components:      Result Value   Influenza A, POC Positive (*)    All other components within normal limits    EKG   Radiology No results found.  Procedures Procedures (including critical care time)  Medications Ordered in UC Medications - No data to display  Initial Impression / Assessment and Plan / UC Course  I have reviewed the triage vital signs and the nursing notes.  Pertinent labs & imaging results  that were available during my care of the patient were reviewed by me and considered in my medical decision making (see chart for details).  Flu test positive in office.  Will prescribe Tamiflu as is unclear how long he has actually had the flu and patient remains febrile and symptomatic.  Encouraged symptomatic treatment otherwise.  Recommended follow-up if symptoms fail to improve or worsen.  Final Clinical Impressions(s) / UC Diagnoses   Final diagnoses:  Influenza A   Discharge Instructions   None    ED Prescriptions     Medication Sig Dispense Auth. Provider   oseltamivir (TAMIFLU) 75 MG capsule Take 1 capsule (75 mg total) by mouth every 12 (twelve) hours. 10 capsule Tomi Bamberger, PA-C      PDMP not reviewed this encounter.   Tomi Bamberger, PA-C 08/05/21 1057

## 2021-08-05 NOTE — ED Triage Notes (Signed)
Pt c/o fever at home (+100), chills, headache, nausea, vomiting, nasal drainage. Had sore throat and cough but sx have resolved.  Denies diarrhea, constipation.    Onset last Friday

## 2021-09-05 ENCOUNTER — Ambulatory Visit: Payer: Self-pay | Admitting: Nurse Practitioner

## 2021-09-05 ENCOUNTER — Encounter: Payer: Self-pay | Admitting: Family Medicine

## 2021-09-05 ENCOUNTER — Other Ambulatory Visit: Payer: Self-pay

## 2021-09-05 DIAGNOSIS — Z113 Encounter for screening for infections with a predominantly sexual mode of transmission: Secondary | ICD-10-CM

## 2021-09-05 LAB — GRAM STAIN

## 2021-09-05 NOTE — Progress Notes (Signed)
Pt here for STD screening.  Gram stain results reviewed, no treatment required.  Pt declined condoms. Kellianne Ek M Kseniya Grunden, RN  

## 2021-09-06 NOTE — Progress Notes (Signed)
[facility] Department STI clinic/screening visit  Subjective:  Seth Harvey. is a 20 y.o. male being seen today for an STI screening visit. The patient reports they do have symptoms.    Patient has the following medical conditions:  There are no problems to display for this patient.    Chief Complaint  Patient presents with   SEXUALLY TRANSMITTED DISEASE    Screening    HPI  Patient reports to clinic today with reports of genital irritation, no other signs and symptoms reported.   Does the patient or their partner desires a pregnancy in the next year? No  Screening for MPX risk: Does the patient have an unexplained rash? No Is the patient MSM? No Does the patient endorse multiple sex partners or anonymous sex partners? No Did the patient have close or sexual contact with a person diagnosed with MPX? No Has the patient traveled outside the [place] where MPX is endemic? No Is there a high clinical suspicion for MPX-- evidenced by one of the following No  -Unlikely to be chickenpox  -Lymphadenopathy  -Rash that present in same phase of evolution on any given body part   See flowsheet for further details and programmatic requirements.    The following portions of the patient's history were reviewed and updated as appropriate: allergies, current medications, past medical history, past social history, past surgical history and problem list.  Objective:  There were no vitals filed for this visit.  Physical Exam Constitutional:      Appearance: Normal appearance.  HENT:     Head: Normocephalic and atraumatic.  Pulmonary:     Effort: Pulmonary effort is normal.  Genitourinary:    Penis: Normal.      Testes: Normal.     Comments: Pubic area without nits, lice, hair loss, edema, erythema, lesions and inguinal adenopathy. Penis is without rash, lesions and discharge at meatus. Testicles descended bilaterally,nt, no masses or edema.  Musculoskeletal:         General: Normal range of motion.     Cervical back: Full passive range of motion without pain, normal range of motion and neck supple.  Lymphadenopathy:     Cervical: No cervical adenopathy.  Skin:    General: Skin is warm and dry.  Neurological:     Mental Status: He is alert and oriented to person, place, and time.  Psychiatric:        Mood and Affect: Mood normal.        Behavior: Behavior normal. Behavior is cooperative.      Assessment and Plan:  Seth Harvey. is a 20 y.o. male presenting to the [facility] Department for STI screening  1. Screening examination for venereal disease -20 year old male in clinic today for STD screening. -Patient accepted all screenings including oral and urethra GC and denies bloodwork for HIV/RPR.  Patient meets criteria for HepB screening? Yes. Ordered? No - patient declines. Patient meets criteria for HepC screening? Yes. Ordered? No - patient declines.   Treat wet prep per standing order Discussed time line for State Lab results and that patient will be called with positive results and encouraged patient to call if she had not heard in 2 weeks.  Counseled to return or seek care for continued or worsening symptoms Recommended condom use with all sex  - Gram stain - Gonococcus culture - Gonococcus culture     Return if symptoms worsen or fail to improve.  No future appointments.  Gregary Cromer, FNP

## 2021-09-10 LAB — GONOCOCCUS CULTURE

## 2022-03-21 ENCOUNTER — Encounter (HOSPITAL_BASED_OUTPATIENT_CLINIC_OR_DEPARTMENT_OTHER): Payer: Self-pay | Admitting: Urology

## 2022-03-21 ENCOUNTER — Emergency Department (HOSPITAL_BASED_OUTPATIENT_CLINIC_OR_DEPARTMENT_OTHER): Payer: Self-pay

## 2022-03-21 ENCOUNTER — Emergency Department (HOSPITAL_BASED_OUTPATIENT_CLINIC_OR_DEPARTMENT_OTHER)
Admission: EM | Admit: 2022-03-21 | Discharge: 2022-03-21 | Disposition: A | Discharge status: Home / Self Care | Payer: Self-pay | Attending: Emergency Medicine | Admitting: Emergency Medicine

## 2022-03-21 DIAGNOSIS — Y9367 Activity, basketball: Secondary | ICD-10-CM | POA: Insufficient documentation

## 2022-03-21 DIAGNOSIS — M25571 Pain in right ankle and joints of right foot: Secondary | ICD-10-CM | POA: Insufficient documentation

## 2022-03-21 DIAGNOSIS — X509XXA Other and unspecified overexertion or strenuous movements or postures, initial encounter: Secondary | ICD-10-CM | POA: Insufficient documentation

## 2022-03-21 MED ORDER — IBUPROFEN 600 MG PO TABS: 600.0000 mg | ORAL_TABLET | Freq: Four times a day (QID) | ORAL | 0 refills | Status: AC | PRN

## 2022-03-21 MED ORDER — IBUPROFEN 800 MG PO TABS: 800.0000 mg | ORAL_TABLET | Freq: Once | ORAL | Status: DC

## 2022-03-21 NOTE — ED Triage Notes (Signed)
Pt states "feel like I sprained my right ankle Sunday when playing basketball"  Pain with weightbearing, minimal swelling noted

## 2022-03-21 NOTE — ED Provider Notes (Signed)
[facility] EMERGENCY DEPARTMENT Provider Note   CSN: [id number] Arrival date & time: 03/21/22  0915     History  Chief Complaint  Patient presents with   Ankle Pain    Seth Harvey Seth Harvey. is a 21 y.o. male.  Patient is a 21 year old male presenting for right ankle pain after twisting it while playing basketball approximately 3 days ago.  Patient able to ambulate without difficulty.  Mitts to minimal swelling to the outer ankle.  Denies any open wounds or aches in the skin.  Denies any previous injuries to the ankle.  Denies sensation or motor deficits.  The history is provided by the patient. No language interpreter was used.  Ankle Pain Associated symptoms: no fever        Home Medications Prior to Admission medications   Medication Sig Start Date End Date Taking? Authorizing Provider  ibuprofen (ADVIL) 600 MG tablet Take 1 tablet (600 mg total) by mouth every 6 (six) hours as needed. 03/21/22  Yes Edwin Dada P, DO      Allergies    Patient has no known allergies.    Review of Systems   Review of Systems  Constitutional:  Negative for chills and fever.  Skin:  Negative for color change and wound.  Neurological:  Negative for weakness and numbness.    Physical Exam Updated Vital Signs BP 137/78 (BP Location: Right Arm)   Pulse (!) 51   Temp 98.1 F (36.7 C) (Oral)   Resp 18   Ht 5\' 8"  (1.727 m)   Wt 70.8 kg   SpO2 97%   BMI 23.72 kg/m  Physical Exam Vitals and nursing note reviewed.  Constitutional:      Appearance: Normal appearance.  HENT:     Head: Normocephalic and atraumatic.  Cardiovascular:     Rate and Rhythm: Normal rate.     Pulses: Normal pulses.  Pulmonary:     Effort: Pulmonary effort is normal.  Musculoskeletal:     Right ankle: Tenderness present over the lateral malleolus.     Left ankle: Normal.     Right foot: Normal.     Left foot: Normal.  Skin:    Capillary Refill: Capillary refill takes less than 2 seconds.   Neurological:     General: No focal deficit present.     Mental Status: He is alert and oriented to person, place, and time.     Sensory: No sensory deficit.     Motor: No weakness.     ED Results / Procedures / Treatments   Labs (all labs ordered are listed, but only abnormal results are displayed) Labs Reviewed - No data to display  EKG None  Radiology DG Ankle Complete Right  Result Date: 03/21/2022 CLINICAL DATA:  Right ankle pain after playing basketball 2 days ago. EXAM: RIGHT ANKLE - COMPLETE 3+ VIEW COMPARISON:  None Available. FINDINGS: There is no evidence of fracture, dislocation, or joint effusion. There is no evidence of arthropathy or other focal bone abnormality. Soft tissues are unremarkable. IMPRESSION: Negative. Electronically Signed   By: 03/23/2022 M.D.   On: 03/21/2022 09:46    Procedures Procedures    Medications Ordered in ED Medications  ibuprofen (ADVIL) tablet 800 mg (has no administration in time range)    ED Course/ Medical Decision Making/ A&P                           Medical  Decision Making Amount and/or Complexity of Data Reviewed Radiology: ordered.  Risk Prescription drug management.   22:96 AM 21 year old male presenting for right ankle pain after twisting it while playing basketball approximately 3 days ago.  Is alert and oriented x3, no acute distress, afebrile, stable vital signs.  Physical exam demonstrates tenderness palpation of the lateral malleolus.  Normal range of motion.  Neurovascular intact.  X-ray demonstrates no fractures.  Recommended for close follow-up with orthopedic surgery in the next 2 weeks if pain continues.  Ace wrap applied.  No difficulty bearing weight.  Patient's for elevation, ice, rest, Motrin/Tylenol for pain.  Patient in no distress and overall condition improved here in the ED. Detailed discussions were had with the patient regarding current findings, and need for close f/u with PCP or on call  doctor. The patient has been instructed to return immediately if the symptoms worsen in any way for re-evaluation. Patient verbalized understanding and is in agreement with current care plan. All questions answered prior to discharge.         Final Clinical Impression(s) / ED Diagnoses Final diagnoses:  Acute right ankle pain    Rx / DC Orders ED Discharge Orders          Ordered    ibuprofen (ADVIL) 600 MG tablet  Every 6 hours PRN        03/21/22 1101              Franne Forts, DO 03/21/22 1101

## 2022-08-11 ENCOUNTER — Encounter (HOSPITAL_BASED_OUTPATIENT_CLINIC_OR_DEPARTMENT_OTHER): Payer: Self-pay | Admitting: Emergency Medicine

## 2022-08-11 ENCOUNTER — Other Ambulatory Visit: Payer: Self-pay

## 2022-08-11 ENCOUNTER — Emergency Department (HOSPITAL_BASED_OUTPATIENT_CLINIC_OR_DEPARTMENT_OTHER)
Admission: EM | Admit: 2022-08-11 | Discharge: 2022-08-11 | Disposition: A | Discharge status: Home / Self Care | Payer: Self-pay | Attending: Emergency Medicine | Admitting: Emergency Medicine

## 2022-08-11 DIAGNOSIS — U071 COVID-19: Secondary | ICD-10-CM | POA: Insufficient documentation

## 2022-08-11 LAB — RESP PANEL BY RT-PCR (FLU A&B, COVID) ARPGX2
Influenza A by PCR: NEGATIVE
Influenza B by PCR: NEGATIVE
SARS Coronavirus 2 by RT PCR: POSITIVE — AB

## 2022-08-11 NOTE — ED Provider Notes (Signed)
[facility] EMERGENCY DEPARTMENT Provider Note   CSN: [id number] Arrival date & time: 08/11/22  1252     History  Chief Complaint  Patient presents with   Covid Test    [name] Seth Wynn. is a 21 y.o. male who presents emergency department with flulike symptoms.  He had onset of symptoms states the glue did include cough, body aches, chills, fever, sweats.  He had 1 day of mild nausea and vomiting.  Patient states he is actually feeling much better.  Triage ordered labs show positive corona virus test  HPI     Home Medications Prior to Admission medications   Medication Sig Start Date End Date Taking? Authorizing Provider  ibuprofen (ADVIL) 600 MG tablet Take 1 tablet (600 mg total) by mouth every 6 (six) hours as needed. 03/21/22   Franne Forts, DO      Allergies    Patient has no known allergies.    Review of Systems   Review of Systems  Physical Exam Updated Vital Signs BP (!) 142/78 (BP Location: Left Arm)   Pulse 85   Temp 98.4 F (36.9 C) (Oral)   Resp 18   Ht 5\' 7"  (1.702 m)   Wt 73 kg   SpO2 97%   BMI 25.22 kg/m  Physical Exam Vitals and nursing note reviewed.  Constitutional:      General: He is not in acute distress.    Appearance: He is well-developed. He is not diaphoretic.  HENT:     Head: Normocephalic and atraumatic.  Eyes:     General: No scleral icterus.    Conjunctiva/sclera: Conjunctivae normal.  Cardiovascular:     Rate and Rhythm: Normal rate and regular rhythm.     Heart sounds: Normal heart sounds.  Pulmonary:     Effort: Pulmonary effort is normal. No respiratory distress.     Breath sounds: Normal breath sounds.  Abdominal:     Palpations: Abdomen is soft.     Tenderness: There is no abdominal tenderness.  Musculoskeletal:     Cervical back: Normal range of motion and neck supple.  Skin:    General: Skin is warm and dry.  Neurological:     Mental Status: He is alert.  Psychiatric:        Behavior: Behavior  normal.     ED Results / Procedures / Treatments   Labs (all labs ordered are listed, but only abnormal results are displayed) Labs Reviewed  RESP PANEL BY RT-PCR (FLU A&B, COVID) ARPGX2 - Abnormal; Notable for the following components:      Result Value   SARS Coronavirus 2 by RT PCR POSITIVE (*)    All other components within normal limits    EKG None  Radiology No results found.  Procedures Procedures    Medications Ordered in ED Medications - No data to display  ED Course/ Medical Decision Making/ A&P                           Medical Decision Making  Patient here positive for COVID-19.  He is hemodynamically stable and afebrile.  Patient's symptoms are decreasing in severity.  Discussed outpatient follow-up and return precautions.        Final Clinical Impression(s) / ED Diagnoses Final diagnoses:  COVID-19 virus infection    Rx / DC Orders ED Discharge Orders     None         , PA-C  08/11/22 1618    Rondel Baton, MD 08/12/22 [phone]

## 2022-08-11 NOTE — Discharge Instructions (Signed)
Get help right away if: You have trouble breathing. You have pain or pressure in your chest. You are confused. You have bluish lips and fingernails. You have trouble waking from sleep. You have symptoms that get worse. These symptoms may be an emergency. Get help right away. Call 911. Do not wait to see if the symptoms will go away. Do not drive yourself to the hospital. 

## 2022-08-11 NOTE — ED Triage Notes (Signed)
Pt arrives pov, endorses sig other tested positive for Covid, requests Covid test. Reports symptoms of congestion and cough have improved

## 2024-03-15 ENCOUNTER — Other Ambulatory Visit: Payer: Self-pay

## 2024-03-15 ENCOUNTER — Encounter (HOSPITAL_COMMUNITY): Payer: Self-pay | Admitting: *Deleted

## 2024-03-15 ENCOUNTER — Emergency Department (HOSPITAL_COMMUNITY)
Admission: EM | Admit: 2024-03-15 | Discharge: 2024-03-15 | Disposition: A | Discharge status: Home / Self Care | Attending: Emergency Medicine | Admitting: Emergency Medicine

## 2024-03-15 ENCOUNTER — Emergency Department (HOSPITAL_COMMUNITY)

## 2024-03-15 DIAGNOSIS — S99921A Unspecified injury of right foot, initial encounter: Secondary | ICD-10-CM | POA: Diagnosis not present

## 2024-03-15 DIAGNOSIS — M79671 Pain in right foot: Secondary | ICD-10-CM | POA: Diagnosis present

## 2024-03-15 MED ORDER — IBUPROFEN 400 MG PO TABS
600.0000 mg | ORAL_TABLET | Freq: Once | ORAL | Status: AC
  Filled 2024-03-15: qty 1
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
## 2024-03-15 NOTE — Discharge Instructions (Addendum)
 Return for any problem.  As discussed, you may have a fracture in your foot.  Closely follow-up with orthopedics as instructed.

## 2024-03-15 NOTE — ED Notes (Signed)
 Ortho tech called
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
## 2024-03-15 NOTE — Progress Notes (Signed)
 Orthopedic Tech Progress Note Patient Details:  Seth Harvey. 2001-08-13 [id number] Applied medium sized CAM walker boot to right foot. Patient's crutches were adjusted to 5'9''.  Ortho Devices Type of Ortho Device: CAM walker, Crutches Ortho Device/Splint Location: Right foot Ortho Device/Splint Interventions: Ordered, Application, Adjustment   Post Interventions Patient Tolerated: Well Instructions Provided: Care of device  [name] [name] 03/15/2024, 8:07 AM

## 2024-03-28 ENCOUNTER — Ambulatory Visit (LOCAL_COMMUNITY_HEALTH_CENTER): Payer: Self-pay

## 2024-03-28 ENCOUNTER — Other Ambulatory Visit: Payer: Self-pay

## 2024-03-28 DIAGNOSIS — Z111 Encounter for screening for respiratory tuberculosis: Secondary | ICD-10-CM

## 2024-03-31 ENCOUNTER — Ambulatory Visit (LOCAL_COMMUNITY_HEALTH_CENTER)

## 2024-03-31 DIAGNOSIS — Z111 Encounter for screening for respiratory tuberculosis: Secondary | ICD-10-CM

## 2024-03-31 LAB — TB SKIN TEST
Induration: 0 mm
TB Skin Test: NEGATIVE
